# Patient Record
Sex: Female | Born: 1945 | Race: White | Hispanic: No | Marital: Married | Smoking: Former smoker
Health system: Southern US, Community
[De-identification: ages and names within clinical notes are randomized; demographics above are authoritative.]

## PROBLEM LIST (undated history)

## (undated) DIAGNOSIS — E119 Type 2 diabetes mellitus without complications: Secondary | ICD-10-CM

## (undated) DIAGNOSIS — E7800 Pure hypercholesterolemia, unspecified: Secondary | ICD-10-CM

## (undated) DIAGNOSIS — I10 Essential (primary) hypertension: Secondary | ICD-10-CM

## (undated) DIAGNOSIS — C541 Malignant neoplasm of endometrium: Secondary | ICD-10-CM

## (undated) DIAGNOSIS — I219 Acute myocardial infarction, unspecified: Secondary | ICD-10-CM

## (undated) HISTORY — PX: DILATION AND CURETTAGE OF UTERUS: SHX78

## (undated) HISTORY — DX: Acute myocardial infarction, unspecified: I21.9

## (undated) HISTORY — PX: CORONARY ANGIOPLASTY WITH STENT PLACEMENT: SHX49

## (undated) HISTORY — PX: REPLACEMENT TOTAL KNEE BILATERAL: SUR1225

## (undated) HISTORY — DX: Type 2 diabetes mellitus without complications: E11.9

## (undated) HISTORY — PX: OTHER SURGICAL HISTORY: SHX169

## (undated) HISTORY — DX: Pure hypercholesterolemia, unspecified: E78.00

## (undated) HISTORY — PX: TONSILLECTOMY: SUR1361

## (undated) HISTORY — DX: Essential (primary) hypertension: I10

## (undated) HISTORY — DX: Malignant neoplasm of endometrium: C54.1

---

## 2005-05-13 ENCOUNTER — Ambulatory Visit (HOSPITAL_COMMUNITY): Admission: RE | Admit: 2005-05-13 | Discharge: 2005-05-13 | Payer: Self-pay | Admitting: Orthopaedic Surgery

## 2005-06-17 ENCOUNTER — Ambulatory Visit (HOSPITAL_COMMUNITY): Admission: RE | Admit: 2005-06-17 | Discharge: 2005-06-17 | Payer: Self-pay | Admitting: Orthopaedic Surgery

## 2008-02-25 IMAGING — CR DG CHEST 2V
2 series · 2 of 2 positions shown · non-contrast
Comparison: none

CLINICAL DATA: Preoperative respiratory film.  Patient for knee surgery.  History of sarcoidosis.
 PA AND LATERAL CHEST:

[view not recorded (1 of 2)]
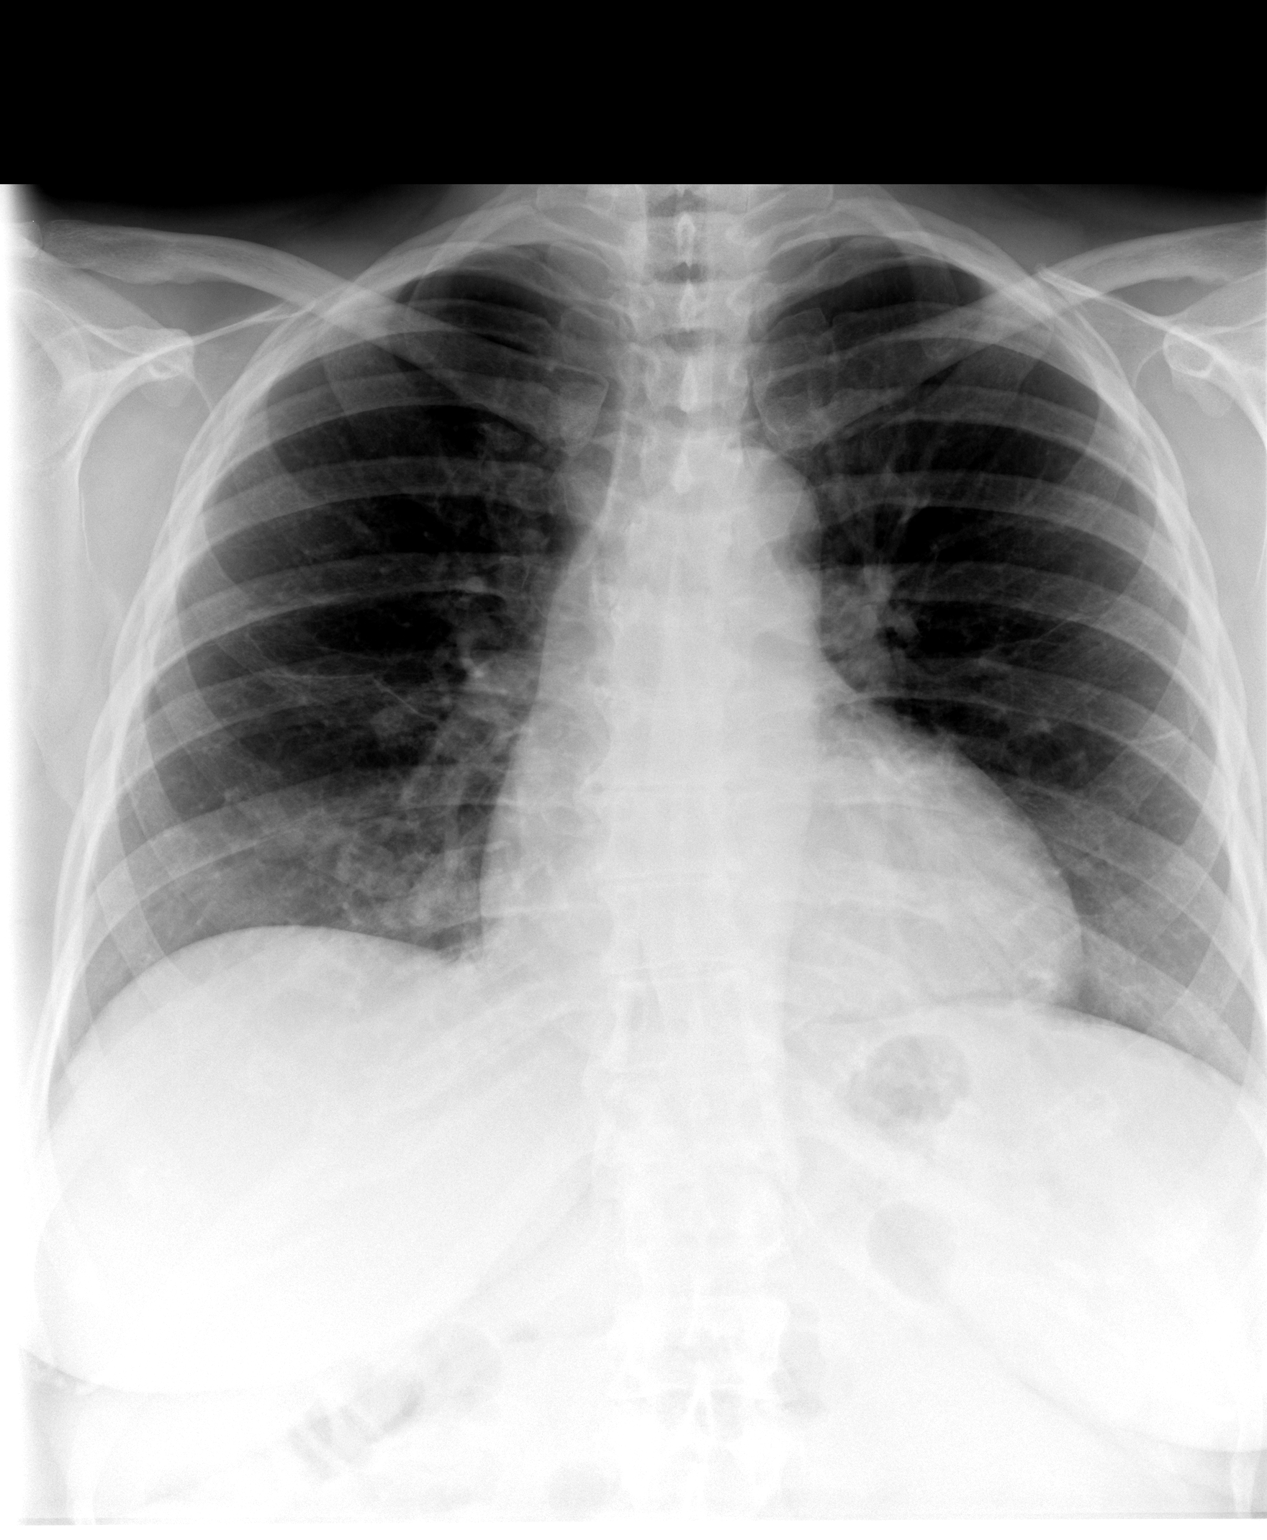

[view not recorded (2 of 2)]
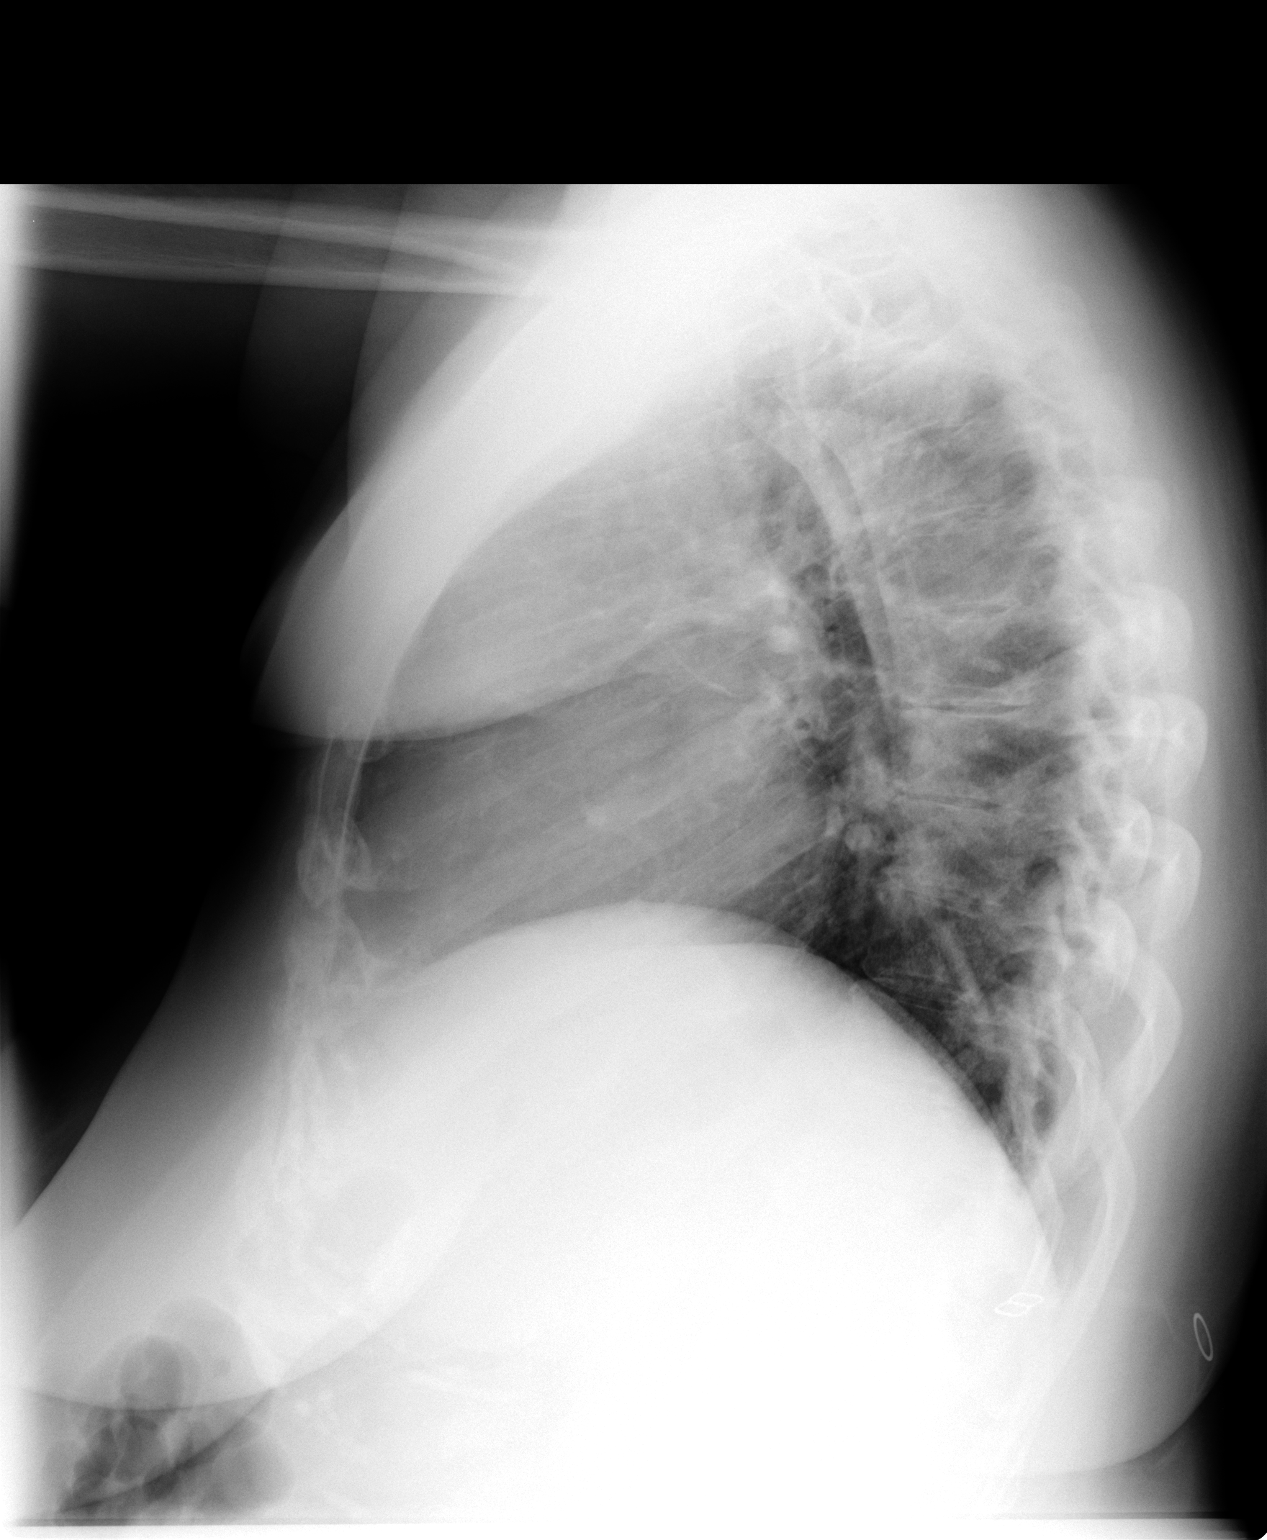

[2 of 2 positions shown; findings below may reference images not displayed]

FINDINGS: The lungs are clear.  No pleural effusion.  Heart size is mildly enlarged.  No lymphadenopathy.  No focal bony abnormality.
IMPRESSION: No acute disease.

## 2018-04-05 HISTORY — PX: TOTAL ABDOMINAL HYSTERECTOMY: SHX209

## 2019-12-19 ENCOUNTER — Encounter: Payer: Self-pay | Admitting: Internal Medicine

## 2020-02-19 ENCOUNTER — Other Ambulatory Visit: Payer: Self-pay

## 2020-02-19 ENCOUNTER — Encounter: Payer: Self-pay | Admitting: Gastroenterology

## 2020-02-19 ENCOUNTER — Ambulatory Visit: Payer: Medicare PPO | Admitting: Gastroenterology

## 2020-02-19 ENCOUNTER — Encounter: Payer: Self-pay | Admitting: *Deleted

## 2020-02-19 DIAGNOSIS — Z1211 Encounter for screening for malignant neoplasm of colon: Secondary | ICD-10-CM | POA: Diagnosis not present

## 2020-02-19 DIAGNOSIS — Z800 Family history of malignant neoplasm of digestive organs: Secondary | ICD-10-CM | POA: Diagnosis not present

## 2020-02-19 NOTE — Progress Notes (Signed)
Primary Care Physician:  Gardiner Rhyme, MD  Referring Physician: Dr. Manuella Ghazi  Primary Gastroenterologist:  Dr. Gala Romney   Chief Complaint  Patient presents with  . Colonoscopy    last tcs >10 yrs ago-no polyps  . Constipation    occ    HPI:   Briana Collier is a 74 y.o. female presenting today at the request of Dr. Manuella Ghazi for screening colonoscopy. Last colonoscopy greater than 10 years ago in [place]. She does not recall a history of colon polyps. Notes her father was diagnosed with colon cancer at age 3.   No rectal bleeding. No abdominal pain. No significant reflux issues unless eating tomatoes. No dysphagia. No changes in bowel habits. Getting off schedule will affect bowel habits. Drinks lots of water and eats fiber, which helps her stay regular. No unexplained weight loss or lack of appetite.   Past Medical History:  Diagnosis Date  . Diabetes ([place])   . Endometrial cancer ([place])   . HTN (hypertension)   . Hypercholesterolemia   . MI (myocardial infarction) ([place])    about 9 years ago     Past Surgical History:  Procedure Laterality Date  . cholecystectomy    . CORONARY ANGIOPLASTY WITH STENT PLACEMENT     around 2012  . DILATION AND CURETTAGE OF UTERUS    . REPLACEMENT TOTAL KNEE BILATERAL    . right knee arthroscopy    . TONSILLECTOMY    . TOTAL ABDOMINAL HYSTERECTOMY  2020    Current Outpatient Medications  Medication Sig Dispense Refill  . acetaminophen (TYLENOL) 650 MG CR tablet Take 1,300 mg by mouth at bedtime.    . ALPRAZolam (XANAX) 0.25 MG tablet Take 1 tablet by mouth as needed.    Marland Kitchen amLODipine (NORVASC) 10 MG tablet Take 1 tablet by mouth daily.    Marland Kitchen aspirin 81 MG EC tablet Take 1 tablet by mouth daily.    Marland Kitchen glipiZIDE (GLUCOTROL) 5 MG tablet Take 1 tablet by mouth 2 (two) times daily.    . insulin detemir (LEVEMIR FLEXTOUCH) 100 UNIT/ML FlexPen Inject 42 Units into the skin at bedtime.    . metFORMIN (GLUCOPHAGE-XR) 500 MG 24 hr tablet Take 1 tablet by  mouth daily.    . Multiple Vitamins-Minerals (CENTRUM SILVER PO) Take 1 tablet by mouth daily.    . Multiple Vitamins-Minerals (PRESERVISION AREDS 2 PO) Take 1 tablet by mouth 2 (two) times daily.    . simvastatin (ZOCOR) 20 MG tablet Take 1 tablet by mouth daily.    . valsartan-hydrochlorothiazide (DIOVAN-HCT) 160-12.5 MG tablet Take 1 tablet by mouth daily.     No current facility-administered medications for this visit.    Allergies as of 02/19/2020 - Review Complete 02/19/2020  Allergen Reaction Noted  . Demerol [meperidine hcl] Nausea And Vomiting 02/19/2020  . Morphine and related Nausea And Vomiting 02/19/2020  . Penicillins Hives 02/19/2020    Family History  Problem Relation Age of Onset  . Colon cancer Father 34    Social History   Socioeconomic History  . Marital status: Married    Spouse name: Not on file  . Number of children: Not on file  . Years of education: Not on file  . Highest education level: Not on file  Occupational History  . Occupation: retired    Comment: [employer]  Tobacco Use  . Smoking status: Former Research scientist (life sciences)  . Smokeless tobacco: Never Used  . Tobacco comment: quit age 12  Substance and Sexual Activity  .  Alcohol use: Never  . Drug use: Never  . Sexual activity: Not on file  Other Topics Concern  . Not on file  Social History Narrative  . Not on file   Social Determinants of Health   Financial Resource Strain:   . Difficulty of Paying Living Expenses: Not on file  Food Insecurity:   . Worried About Charity fundraiser in the Last Year: Not on file  . Ran Out of Food in the Last Year: Not on file  Transportation Needs:   . Lack of Transportation (Medical): Not on file  . Lack of Transportation (Non-Medical): Not on file  Physical Activity:   . Days of Exercise per Week: Not on file  . Minutes of Exercise per Session: Not on file  Stress:   . Feeling of Stress : Not on file  Social Connections:   . Frequency of Communication with  Friends and Family: Not on file  . Frequency of Social Gatherings with Friends and Family: Not on file  . Attends Religious Services: Not on file  . Active Member of Clubs or Organizations: Not on file  . Attends Archivist Meetings: Not on file  . Marital Status: Not on file  Intimate Partner Violence:   . Fear of Current or Ex-Partner: Not on file  . Emotionally Abused: Not on file  . Physically Abused: Not on file  . Sexually Abused: Not on file    Review of Systems: Gen: Denies any fever, chills, fatigue, weight loss, lack of appetite.  CV: Denies chest pain, heart palpitations, peripheral edema, syncope.  Resp: Denies shortness of breath at rest or with exertion. Denies wheezing or cough.  GI: see HPI GU : Denies urinary burning, urinary frequency, urinary hesitancy MS: Denies joint pain, muscle weakness, cramps, or limitation of movement.  Derm: Denies rash, itching, dry skin Psych: Denies depression, anxiety, memory loss, and confusion Heme: Denies bruising, bleeding, and enlarged lymph nodes.  Physical Exam: BP 140/89   Pulse 77   Temp (!) 96.8 F (36 C) (Temporal)   Ht 5\' 3"  (1.6 m)   Wt 210 lb 3.2 oz (95.3 kg)   BMI 37.24 kg/m  General:   Alert and oriented. Pleasant and cooperative. Well-nourished and well-developed.  Head:  Normocephalic and atraumatic. Eyes:  Without icterus, sclera clear and conjunctiva pink.  Ears:  Normal auditory acuity. Mouth:  Mask in place Lungs:  Clear to auscultation bilaterally. No wheezes, rales, or rhonchi. No distress.  Heart:  S1, S2 present with soft systolic murmur Abdomen:  +BS, soft, non-tender and non-distended. No HSM noted. No guarding or rebound. No masses appreciated.  Rectal:  Deferred  Msk:  Symmetrical without gross deformities. Normal posture. Extremities:  Without edema. Neurologic:  Alert and  oriented x4;  grossly normal neurologically. Skin:  Intact without significant lesions or rashes. Psych:  Alert  and cooperative. Normal mood and affect.  ASSESSMENT: Briana Collier is a 74 y.o. female presenting today at request of Dr. Manuella Ghazi for consideration of colonoscopy; her last colonoscopy was over 10 years ago without polyps. She does endorse family history of colon cancer in father at age 70.  She has no concerning lower GI signs/symptoms. Overall, in fairly good health. History of endometrial cancer diagnosed last year, undergoing total hysterectomy. Desires to pursue colonoscopy at this time.    PLAN: Proceed with colonoscopy by Dr. Gala Romney in near future: the risks, benefits, and alternatives have been discussed with the patient in detail.  The patient states understanding and desires to proceed.   Half dose insulin evening before, no oral diabetes medications day of procedure  Further recommendations to follow  Annitta Needs, PhD, ANP-BC [facility]

## 2020-02-19 NOTE — Patient Instructions (Addendum)
We are arranging a colonoscopy in the future with Dr. Gala Romney.  Please take 1/2 dose of insulin the evening before, and don't take glipizide or metformin the morning of the procedure.  Further recommendations to follow!  It was a pleasure to see you today. I want to create trusting relationships with patients to provide genuine, compassionate, and quality care. I value your feedback. If you receive a survey regarding your visit,  I greatly appreciate you taking time to fill this out.   Annitta Needs, PhD, ANP-BC [facility]astroenter[facility]

## 2020-04-08 ENCOUNTER — Telehealth: Payer: Self-pay | Admitting: *Deleted

## 2020-04-08 NOTE — Telephone Encounter (Signed)
PA approved via [employer] for TCS. Auth# [id number] DOS 04/23/2020-05/23/2020

## 2020-04-18 ENCOUNTER — Telehealth: Payer: Self-pay | Admitting: *Deleted

## 2020-04-18 NOTE — Telephone Encounter (Signed)
Called pt. Aware will have to call with March schedule once we receive it. Called endo and LMOVM.

## 2020-04-18 NOTE — Telephone Encounter (Signed)
Pt has procedure scheduled for 04/23/2020 but needs to reschedule due to winter weather coming in.  She said that their roads are terrible when it snows so she doesn't anticipate being able to get out.  Can you call her at your convenience to reschedule please?

## 2020-04-22 ENCOUNTER — Other Ambulatory Visit (HOSPITAL_COMMUNITY): Admission: RE | Admit: 2020-04-22 | Payer: Medicare PPO | Source: Ambulatory Visit

## 2020-04-23 ENCOUNTER — Ambulatory Visit (HOSPITAL_COMMUNITY): Admission: RE | Admit: 2020-04-23 | Payer: Medicare PPO | Source: Home / Self Care | Admitting: Internal Medicine

## 2020-04-23 ENCOUNTER — Encounter (HOSPITAL_COMMUNITY): Admission: RE | Payer: Self-pay | Source: Home / Self Care

## 2020-04-23 ENCOUNTER — Telehealth: Payer: Self-pay | Admitting: *Deleted

## 2020-04-23 SURGERY — COLONOSCOPY
Anesthesia: Moderate Sedation

## 2020-04-23 NOTE — Telephone Encounter (Signed)
Duplicate message. 

## 2020-04-29 NOTE — Telephone Encounter (Signed)
Called pt. She has been rescheduled to 3/2 at 9:30am. Aware will mail instructions with covid test appt. Confirmed mailing address.

## 2020-05-29 ENCOUNTER — Telehealth: Payer: Self-pay | Admitting: Internal Medicine

## 2020-05-29 NOTE — Telephone Encounter (Signed)
Spoke with pt. Call was address. Pt discuss her medication that was given by her GYN doctor. Pt will discuss further with that office.

## 2020-05-29 NOTE — Telephone Encounter (Signed)
Pt has questions about a medication. Please call her at [phone]

## 2020-06-02 ENCOUNTER — Other Ambulatory Visit (HOSPITAL_COMMUNITY)
Admission: RE | Admit: 2020-06-02 | Discharge: 2020-06-02 | Disposition: A | Discharge status: Home / Self Care | Payer: Medicare PPO | Source: Ambulatory Visit | Attending: Internal Medicine | Admitting: Internal Medicine

## 2020-06-02 ENCOUNTER — Other Ambulatory Visit: Payer: Self-pay

## 2020-06-03 ENCOUNTER — Other Ambulatory Visit (HOSPITAL_COMMUNITY)
Admission: RE | Admit: 2020-06-03 | Discharge: 2020-06-03 | Disposition: A | Discharge status: Home / Self Care | Payer: Medicare PPO | Source: Ambulatory Visit | Attending: Internal Medicine | Admitting: Internal Medicine

## 2020-06-03 DIAGNOSIS — Z01812 Encounter for preprocedural laboratory examination: Secondary | ICD-10-CM | POA: Insufficient documentation

## 2020-06-03 DIAGNOSIS — Z20822 Contact with and (suspected) exposure to covid-19: Secondary | ICD-10-CM | POA: Insufficient documentation

## 2020-06-03 LAB — SARS CORONAVIRUS 2 (TAT 6-24 HRS): SARS Coronavirus 2: NEGATIVE

## 2020-06-03 NOTE — Progress Notes (Signed)
I created another covid order today because yesterday's specimen was discontinued. [facility] was not able to process the specimen. Pt. Came back this morning and I swabbed her again. Threasa Beards called me and told me what had happened. She called Hoyle Sauer and asked her to schedule pt.'s appointment for today. When I went to release the order it said release anyway or cancel. I clicked on release it anyway. (do over)

## 2020-06-04 ENCOUNTER — Other Ambulatory Visit: Payer: Self-pay

## 2020-06-04 ENCOUNTER — Ambulatory Visit (HOSPITAL_COMMUNITY)
Admission: RE | Admit: 2020-06-04 | Discharge: 2020-06-04 | Disposition: A | Discharge status: Home / Self Care | Payer: Medicare PPO | Attending: Internal Medicine | Admitting: Internal Medicine

## 2020-06-04 ENCOUNTER — Encounter (HOSPITAL_COMMUNITY): Payer: Self-pay | Admitting: Internal Medicine

## 2020-06-04 ENCOUNTER — Encounter (HOSPITAL_COMMUNITY)
Admission: RE | Disposition: A | Discharge status: Home / Self Care | Payer: Self-pay | Source: Home / Self Care | Attending: Internal Medicine

## 2020-06-04 DIAGNOSIS — K5730 Diverticulosis of large intestine without perforation or abscess without bleeding: Secondary | ICD-10-CM | POA: Insufficient documentation

## 2020-06-04 DIAGNOSIS — Z880 Allergy status to penicillin: Secondary | ICD-10-CM | POA: Diagnosis not present

## 2020-06-04 DIAGNOSIS — Z79899 Other long term (current) drug therapy: Secondary | ICD-10-CM | POA: Insufficient documentation

## 2020-06-04 DIAGNOSIS — K5140 Inflammatory polyps of colon without complications: Secondary | ICD-10-CM | POA: Insufficient documentation

## 2020-06-04 DIAGNOSIS — K635 Polyp of colon: Secondary | ICD-10-CM

## 2020-06-04 DIAGNOSIS — I441 Atrioventricular block, second degree: Secondary | ICD-10-CM | POA: Diagnosis not present

## 2020-06-04 DIAGNOSIS — Z800 Family history of malignant neoplasm of digestive organs: Secondary | ICD-10-CM | POA: Diagnosis not present

## 2020-06-04 DIAGNOSIS — Z885 Allergy status to narcotic agent status: Secondary | ICD-10-CM | POA: Insufficient documentation

## 2020-06-04 DIAGNOSIS — D124 Benign neoplasm of descending colon: Secondary | ICD-10-CM | POA: Insufficient documentation

## 2020-06-04 DIAGNOSIS — Z7984 Long term (current) use of oral hypoglycemic drugs: Secondary | ICD-10-CM | POA: Insufficient documentation

## 2020-06-04 DIAGNOSIS — Z7982 Long term (current) use of aspirin: Secondary | ICD-10-CM | POA: Insufficient documentation

## 2020-06-04 DIAGNOSIS — D122 Benign neoplasm of ascending colon: Secondary | ICD-10-CM | POA: Insufficient documentation

## 2020-06-04 DIAGNOSIS — Z87891 Personal history of nicotine dependence: Secondary | ICD-10-CM | POA: Diagnosis not present

## 2020-06-04 DIAGNOSIS — Z1211 Encounter for screening for malignant neoplasm of colon: Secondary | ICD-10-CM

## 2020-06-04 DIAGNOSIS — Z794 Long term (current) use of insulin: Secondary | ICD-10-CM | POA: Diagnosis not present

## 2020-06-04 HISTORY — PX: COLONOSCOPY: SHX5424

## 2020-06-04 HISTORY — PX: POLYPECTOMY: SHX5525

## 2020-06-04 LAB — GLUCOSE, CAPILLARY: Glucose-Capillary: 179 mg/dL — ABNORMAL HIGH (ref 70–99)

## 2020-06-04 SURGERY — COLONOSCOPY
Anesthesia: Moderate Sedation

## 2020-06-04 MED ORDER — FENTANYL CITRATE (PF) 100 MCG/2ML IJ SOLN
INTRAMUSCULAR | Status: DC | PRN
  Administered 2020-06-04: 25 ug via INTRAVENOUS
  Administered 2020-06-04: 12.5 ug via INTRAVENOUS

## 2020-06-04 MED ORDER — ONDANSETRON HCL 4 MG/2ML IJ SOLN
INTRAMUSCULAR | Status: AC
  Filled 2020-06-04: qty 2

## 2020-06-04 MED ORDER — MIDAZOLAM HCL 5 MG/5ML IJ SOLN
INTRAMUSCULAR | Status: AC
  Filled 2020-06-04: qty 10

## 2020-06-04 MED ORDER — SODIUM CHLORIDE 0.9 % IV SOLN: INTRAVENOUS | Status: DC

## 2020-06-04 MED ORDER — FENTANYL CITRATE (PF) 100 MCG/2ML IJ SOLN
INTRAMUSCULAR | Status: AC
  Filled 2020-06-04: qty 2

## 2020-06-04 MED ORDER — STERILE WATER FOR IRRIGATION IR SOLN
Status: DC | PRN
  Administered 2020-06-04: 1.5 mL

## 2020-06-04 MED ORDER — MIDAZOLAM HCL 5 MG/5ML IJ SOLN
INTRAMUSCULAR | Status: DC | PRN
  Administered 2020-06-04 (×2): 1 mg via INTRAVENOUS
  Administered 2020-06-04: 2 mg via INTRAVENOUS
  Administered 2020-06-04 (×4): 1 mg via INTRAVENOUS

## 2020-06-04 MED ORDER — ONDANSETRON HCL 4 MG/2ML IJ SOLN
INTRAMUSCULAR | Status: DC | PRN
  Administered 2020-06-04: 4 mg via INTRAVENOUS

## 2020-06-04 NOTE — H&P (Signed)
@LOGO @   Primary Care Physician:  Gardiner Rhyme, MD Primary Gastroenterologist:  Dr. Gala Romney  Pre-Procedure History & Physical: HPI:  Briana Collier is a 75 y.o. female here for screening colonoscopy.  Father with colon cancer.  Occasional constipation otherwise she is devoid of any lower GI tract symptoms.  Last colonoscopy  - distant past per patient-potentially 20 years ago.  Past Medical History:  Diagnosis Date  . Diabetes ([place])   . Endometrial cancer ([place])   . HTN (hypertension)   . Hypercholesterolemia   . MI (myocardial infarction) ([place])    about 9 years ago     Past Surgical History:  Procedure Laterality Date  . cholecystectomy    . CORONARY ANGIOPLASTY WITH STENT PLACEMENT     around 2012  . DILATION AND CURETTAGE OF UTERUS    . REPLACEMENT TOTAL KNEE BILATERAL    . right knee arthroscopy    . TONSILLECTOMY    . TOTAL ABDOMINAL HYSTERECTOMY  2020    Prior to Admission medications   Medication Sig Start Date End Date Taking? Authorizing Provider  acetaminophen (TYLENOL) 650 MG CR tablet Take 1,300 mg by mouth at bedtime.   Yes [provider]  ALPRAZolam (XANAX) 0.25 MG tablet Take 0.25 mg by mouth daily as needed for anxiety. 01/29/19  Yes [provider]  amLODipine (NORVASC) 10 MG tablet Take 10 mg by mouth daily. 02/04/19  Yes [provider]  ASPERCREME LIDOCAINE EX Apply 1 application topically daily as needed (pain).   Yes [provider]  aspirin 81 MG EC tablet Take 81 mg by mouth daily.   Yes [provider]  calcium carbonate (TUMS - DOSED IN MG ELEMENTAL CALCIUM) 500 MG chewable tablet Chew 500 mg by mouth daily as needed for indigestion or heartburn.   Yes [provider]  glipiZIDE (GLUCOTROL) 5 MG tablet Take 5 mg by mouth 2 (two) times daily before a meal. 03/06/19  Yes [provider]  insulin detemir (LEVEMIR FLEXTOUCH) 100 UNIT/ML FlexPen Inject 42 Units into the skin at bedtime. 01/12/19   Yes [provider]  Multiple Vitamins-Minerals (CENTRUM SILVER PO) Take 1 tablet by mouth daily.   Yes [provider]  Multiple Vitamins-Minerals (PRESERVISION AREDS 2 PO) Take 1 tablet by mouth 2 (two) times daily.   Yes [provider]  Polyethyl Glycol-Propyl Glycol (SYSTANE OP) Place 1 drop into both eyes daily as needed (dry eyes).   Yes [provider]  simvastatin (ZOCOR) 20 MG tablet Take 20 mg by mouth every evening. 02/07/19  Yes [provider]  triamcinolone (NASACORT) 55 MCG/ACT AERO nasal inhaler Place 2 sprays into the nose daily as needed (allergies).   Yes [provider]  valsartan-hydrochlorothiazide (DIOVAN-HCT) 160-12.5 MG tablet Take 1 tablet by mouth daily. 09/10/19  Yes [provider]  metFORMIN (GLUCOPHAGE-XR) 500 MG 24 hr tablet Take 500 mg by mouth every evening. 12/25/19   [provider]    Allergies as of 04/29/2020 - Review Complete 04/16/2020  Allergen Reaction Noted  . Demerol [meperidine hcl] Nausea And Vomiting 02/19/2020  . Morphine and related Nausea And Vomiting 02/19/2020  . Penicillins Hives 02/19/2020    Family History  Problem Relation Age of Onset  . Colon cancer Father 52    Social History   Socioeconomic History  . Marital status: Married    Spouse name: Not on file  . Number of children: Not on file  . Years of education: Not on  file  . Highest education level: Not on file  Occupational History  . Occupation: retired    Comment: [employer]  Tobacco Use  . Smoking status: Former Research scientist (life sciences)  . Smokeless tobacco: Never Used  . Tobacco comment: quit age 57  Vaping Use  . Vaping Use: Never used  Substance and Sexual Activity  . Alcohol use: Never  . Drug use: Never  . Sexual activity: Not on file  Other Topics Concern  . Not on file  Social History Narrative  . Not on file   Social Determinants of Health   Financial Resource Strain: Not on file  Food  Insecurity: Not on file  Transportation Needs: Not on file  Physical Activity: Not on file  Stress: Not on file  Social Connections: Not on file  Intimate Partner Violence: Not on file    Review of Systems: See HPI, otherwise negative ROS  Physical Exam: BP (!) 148/80   Pulse 60   Temp 97.9 F (36.6 C) (Oral)   Resp 17   Ht 5\' 3"  (1.6 m)   Wt 92.5 kg   SpO2 99%   BMI 36.14 kg/m  General:   Alert,  Well-developed, well-nourished, pleasant and cooperative in NAD Neck:  Supple; no masses or thyromegaly. No significant cervical adenopathy. Lungs:  Clear throughout to auscultation.   No wheezes, crackles, or rhonchi. No acute distress. Heart:  Regular rate and rhythm; no murmurs, clicks, rubs,  or gallops. Abdomen: Non-distended, normal bowel sounds.  Soft and nontender without appreciable mass or hepatosplenomegaly.   Impression/Plan: 75 year old lady here for screening colonoscopy.The risks, benefits, limitations, alternatives and imponderables have been reviewed with the patient. Questions have been answered. All parties are agreeable.      Notice: This dictation was prepared with Dragon dictation along with smaller phrase technology. Any transcriptional errors that result from this process are unintentional and may not be corrected upon review.

## 2020-06-04 NOTE — Progress Notes (Signed)
Pt entered post-op with noted heart block on ECG.  Intra-op nurse, Otis Peak, stated pt went into A-Fib during procedure but then converted to NSR.  Informed Dr. Gala Romney who ordered EKG.  VSS, skin pink, warm, and dry.  Pt denies any chest discomfort or dizziness.  EKG reviewed by Dr. Gala Romney, copy given to pt and instructed to keep appt with cardiologist which pt states is scheduled next Tues.  Advised pt to go to ED for palpitations, chest discomfort, dizziness, or other concerns.  Pt verbalized understanding.

## 2020-06-04 NOTE — Discharge Instructions (Signed)
Colonoscopy Discharge Instructions  Read the instructions outlined below and refer to this sheet in the next few weeks. These discharge instructions provide you with general information on caring for yourself after you leave the hospital. Your doctor may also give you specific instructions. While your treatment has been planned according to the most current medical practices available, unavoidable complications occasionally occur. If you have any problems or questions after discharge, call Dr. Gala Romney at [phone]. ACTIVITY  You may resume your regular activity, but move at a slower pace for the next 24 hours.   Take frequent rest periods for the next 24 hours.   Walking will help get rid of the air and reduce the bloated feeling in your belly (abdomen).   No driving for 24 hours (because of the medicine (anesthesia) used during the test).    Do not sign any important legal documents or operate any machinery for 24 hours (because of the anesthesia used during the test).  NUTRITION  Drink plenty of fluids.   You may resume your normal diet as instructed by your doctor.   Begin with a light meal and progress to your normal diet. Heavy or fried foods are harder to digest and may make you feel sick to your stomach (nauseated).   Avoid alcoholic beverages for 24 hours or as instructed.  MEDICATIONS  You may resume your normal medications unless your doctor tells you otherwise.  WHAT YOU CAN EXPECT TODAY  Some feelings of bloating in the abdomen.   Passage of more gas than usual.   Spotting of blood in your stool or on the toilet paper.  IF YOU HAD POLYPS REMOVED DURING THE COLONOSCOPY:  No aspirin products for 7 days or as instructed.   No alcohol for 7 days or as instructed.   Eat a soft diet for the next 24 hours.  FINDING OUT THE RESULTS OF YOUR TEST Not all test results are available during your visit. If your test results are not back during the visit, make an appointment  with your caregiver to find out the results. Do not assume everything is normal if you have not heard from your caregiver or the medical facility. It is important for you to follow up on all of your test results.  SEEK IMMEDIATE MEDICAL ATTENTION IF:  You have more than a spotting of blood in your stool.   Your belly is swollen (abdominal distention).   You are nauseated or vomiting.   You have a temperature over 101.   You have abdominal pain or discomfort that is severe or gets worse throughout the day.    5 polyps removed in your colon today  Polyp diverticulosis information provided  Further recommendations to follow pending review of pathology report  At patient request, I called Kyllie Pettijohn at [phone] -got voicemail.  Left message with information.  Use a stool softener as needed for occasional constipation.  Abnormal rhythm on monitor followed up at EKG which shows some variable degree of heart block.  Since you are not having any symptoms at this time we will give you a copy of the EKG and recommend you see Dr. Manuella Ghazi or your cardiologist later this week for further evaluation as they deem appropriate.--Keep scheduled appt with cardiologist for Tuesday, March 8   Diverticulosis  Diverticulosis is a condition that develops when small pouches (diverticula) form in the wall of the large intestine (colon). The colon is where water is absorbed and stool (feces) is formed. The pouches  form when the inside layer of the colon pushes through weak spots in the outer layers of the colon. You may have a few pouches or many of them. The pouches usually do not cause problems unless they become inflamed or infected. When this happens, the condition is called diverticulitis. What are the causes? The cause of this condition is not known. What increases the risk? The following factors may make you more likely to develop this condition:  Being older than age 31. Your risk for this  condition increases with age. Diverticulosis is rare among people younger than age 16. By age 17, many people have it.  Eating a low-fiber diet.  Having frequent constipation.  Being overweight.  Not getting enough exercise.  Smoking.  Taking over-the-counter pain medicines, like aspirin and ibuprofen.  Having a family history of diverticulosis. What are the signs or symptoms? In most people, there are no symptoms of this condition. If you do have symptoms, they may include:  Bloating.  Cramps in the abdomen.  Constipation or diarrhea.  Pain in the lower left side of the abdomen. How is this diagnosed? Because diverticulosis usually has no symptoms, it is most often diagnosed during an exam for other colon problems. The condition may be diagnosed by:  Using a flexible scope to examine the colon (colonoscopy).  Taking an X-ray of the colon after dye has been put into the colon (barium enema).  Having a CT scan. How is this treated? You may not need treatment for this condition. Your health care provider may recommend treatment to prevent problems. You may need treatment if you have symptoms or if you previously had diverticulitis. Treatment may include:  Eating a high-fiber diet.  Taking a fiber supplement.  Taking a live bacteria supplement (probiotic).  Taking medicine to relax your colon.   Follow these instructions at home: Medicines  Take over-the-counter and prescription medicines only as told by your health care provider.  If told by your health care provider, take a fiber supplement or probiotic. Constipation prevention Your condition may cause constipation. To prevent or treat constipation, you may need to:  Drink enough fluid to keep your urine pale yellow.  Take over-the-counter or prescription medicines.  Eat foods that are high in fiber, such as beans, whole grains, and fresh fruits and vegetables.  Limit foods that are high in fat and processed  sugars, such as fried or sweet foods.   General instructions  Try not to strain when you have a bowel movement.  Keep all follow-up visits as told by your health care provider. This is important. Contact a health care provider if you:  Have pain in your abdomen.  Have bloating.  Have cramps.  Have not had a bowel movement in 3 days. Get help right away if:  Your pain gets worse.  Your bloating becomes very bad.  You have a fever or chills, and your symptoms suddenly get worse.  You vomit.  You have bowel movements that are bloody or black.  You have bleeding from your rectum. Summary  Diverticulosis is a condition that develops when small pouches (diverticula) form in the wall of the large intestine (colon).  You may have a few pouches or many of them.  This condition is most often diagnosed during an exam for other colon problems.  Treatment may include increasing the fiber in your diet, taking supplements, or taking medicines. This information is not intended to replace advice given to you by your health care  provider. Make sure you discuss any questions you have with your health care provider. Document Revised: 10/19/2018 Document Reviewed: 10/19/2018 Elsevier Patient Education  [place].  Colon Polyps  Colon polyps are tissue growths inside the colon, which is part of the large intestine. They are one of the types of polyps that can grow in the body. A polyp may be a round bump or a mushroom-shaped growth. You could have one polyp or more than one. Most colon polyps are noncancerous (benign). However, some colon polyps can become cancerous over time. Finding and removing the polyps early can help prevent this. What are the causes? The exact cause of colon polyps is not known. What increases the risk? The following factors may make you more likely to develop this condition:  Having a family history of colorectal cancer or colon polyps.  Being older  than 76 years of age.  Being younger than 76 years of age and having a significant family history of colorectal cancer or colon polyps or a genetic condition that puts you at higher risk of getting colon polyps.  Having inflammatory bowel disease, such as ulcerative colitis or Crohn's disease.  Having certain conditions passed from parent to child (hereditary conditions), such as: ? Familial adenomatous polyposis (FAP). ? Lynch syndrome. ? Turcot syndrome. ? Peutz-Jeghers syndrome. ? MUTYH-associated polyposis (MAP).  Being overweight.  Certain lifestyle factors. These include smoking cigarettes, drinking too much alcohol, not getting enough exercise, and eating a diet that is high in fat and red meat and low in fiber.  Having had childhood cancer that was treated with radiation of the abdomen. What are the signs or symptoms? Many times, there are no symptoms. If you have symptoms, they may include:  Blood coming from the rectum during a bowel movement.  Blood in the stool (feces). The blood may be bright red or very dark in color.  Pain in the abdomen.  A change in bowel habits, such as constipation or diarrhea. How is this diagnosed? This condition is diagnosed with a colonoscopy. This is a procedure in which a lighted, flexible scope is inserted into the opening between the buttocks (anus) and then passed into the colon to examine the area. Polyps are sometimes found when a colonoscopy is done as part of routine cancer screening tests. How is this treated? This condition is treated by removing any polyps that are found. Most polyps can be removed during a colonoscopy. Those polyps will then be tested for cancer. Additional treatment may be needed depending on the results of testing. Follow these instructions at home: Eating and drinking  Eat foods that are high in fiber, such as fruits, vegetables, and whole grains.  Eat foods that are high in calcium and vitamin D, such as  milk, cheese, yogurt, eggs, liver, fish, and broccoli.  Limit foods that are high in fat, such as fried foods and desserts.  Limit the amount of red meat, precooked or cured meat, or other processed meat that you eat, such as hot dogs, sausages, bacon, or meat loaves.  Limit sugary drinks.   Lifestyle  Maintain a healthy weight, or lose weight if recommended by your health care provider.  Exercise every day or as told by your health care provider.  Do not use any products that contain nicotine or tobacco, such as cigarettes, e-cigarettes, and chewing tobacco. If you need help quitting, ask your health care provider.  Do not drink alcohol if: ? Your health care provider tells you  not to drink. ? You are pregnant, may be pregnant, or are planning to become pregnant.  If you drink alcohol: ? Limit how much you use to:  0-1 drink a day for women.  0-2 drinks a day for men. ? Know how much alcohol is in your drink. In the [place] one drink equals one 12 oz bottle of beer (355 mL), one 5 oz glass of wine (148 mL), or one 1 oz glass of hard liquor (44 mL). General instructions  Take over-the-counter and prescription medicines only as told by your health care provider.  Keep all follow-up visits. This is important. This includes having regularly scheduled colonoscopies. Talk to your health care provider about when you need a colonoscopy. Contact a health care provider if:  You have new or worsening bleeding during a bowel movement.  You have new or increased blood in your stool.  You have a change in bowel habits.  You lose weight for no known reason. Summary  Colon polyps are tissue growths inside the colon, which is part of the large intestine. They are one type of polyp that can grow in the body.  Most colon polyps are noncancerous (benign), but some can become cancerous over time.  This condition is diagnosed with a colonoscopy.  This condition is treated by removing any  polyps that are found. Most polyps can be removed during a colonoscopy. This information is not intended to replace advice given to you by your health care provider. Make sure you discuss any questions you have with your health care provider. Document Revised: 07/11/2019 Document Reviewed: 07/11/2019 Elsevier Patient Education  2021 [employer].

## 2020-06-04 NOTE — Op Note (Addendum)
[facility] Patient Name: Briana Collier Procedure Date: 06/04/2020 8:45 AM MRN: [id number] Date of Birth: May 16, 1945 Attending MD: Norvel Richards , MD CSN: [id number] Age: 75 Admit Type: Outpatient Procedure:                Colonoscopy Indications:              Screening for colorectal malignant neoplasm Providers:                Norvel Richards, MD, Jeanann Lewandowsky. Sharon Seller, RN,                            Wynonia Musty Tech, Technician Referring MD:              Medicines:                Fentanyl 37.5 micrograms IV, Midazolam 4 mg IV Complications:            No immediate complications. Estimated Blood Loss:     Estimated blood loss was minimal. Procedure:                Pre-Anesthesia Assessment:                           - Prior to the procedure, a History and Physical                            was performed, and patient medications and                            allergies were reviewed. The patient's tolerance of                            previous anesthesia was also reviewed. The risks                            and benefits of the procedure and the sedation                            options and risks were discussed with the patient.                            All questions were answered, and informed consent                            was obtained. Prior Anticoagulants: The patient has                            taken no previous anticoagulant or antiplatelet                            agents. ASA Grade Assessment: III - A patient with                            severe systemic disease. After reviewing the risks  and benefits, the patient was deemed in                            satisfactory condition to undergo the procedure.                           After obtaining informed consent, the colonoscope                            was passed under direct vision. Throughout the                            procedure, the patient's blood pressure,  pulse, and                            oxygen saturations were monitored continuously. The                            CF-[id number] ([id number]) scope was introduced through                            the anus and advanced to the the cecum, identified                            by appendiceal orifice and ileocecal valve. The                            colonoscopy was performed without difficulty. The                            patient tolerated the procedure well. The quality                            of the bowel preparation was adequate. Scope In: 9:06:25 AM Scope Out: 9:37:15 AM Scope Withdrawal Time: 0 hours 15 minutes 46 seconds  Total Procedure Duration: 0 hours 30 minutes 50 seconds  Findings:      The perianal and digital rectal examinations were normal.      Many small and large-mouthed diverticula were found in the sigmoid       colon, descending colon and transverse colon.      Five semi-pedunculated polyps were found in the sigmoid colon,       descending colon and ascending colon. The polyps were 5 to 8 mm in size.       These polyps were removed with a cold snare. Resection and retrieval       were complete. Estimated blood loss was minimal.      The exam was otherwise without abnormality on direct and retroflexion       views. Impression:               - Diverticulosis in the sigmoid colon, in the                            descending colon and in the transverse colon.                           -  Five 5 to 8 mm polyps in the sigmoid colon, in                            the descending colon and in the ascending colon,                            removed with a cold snare. Resected and retrieved.                           - The examination was otherwise normal on direct                            and retroflexion views. Moderate Sedation:      Moderate (conscious) sedation was administered by the endoscopy nurse       and supervised by the endoscopist. The following parameters  were       monitored: oxygen saturation, heart rate, blood pressure, respiratory       rate, EKG, adequacy of pulmonary ventilation, and response to care.       Total physician intraservice time was 35 minutes. Recommendation:           - Patient has a contact number available for                            emergencies. The signs and symptoms of potential                            delayed complications were discussed with the                            patient. Return to normal activities tomorrow.                            Written discharge instructions were provided to the                            patient.                           - Resume previous diet.                           - Continue present medications.                           - Repeat colonoscopy date to be determined after                            pending pathology results are reviewed for                            surveillance based on pathology results.                           - Return to GI office (date not yet determined).  Addendum: Patient was noted to have apparent sinus                            pauses before during and after the procedure on the                            monitor. Twelve-lead EKG obtained afterwards                            demonstrates variable second-degree AV block.                            Patient without any symptoms whatsoever recently.                            This is an incidental finding but needs further                            evaluation. I have given a copy of the tracing to                            the patient and advised she follow-up with either                            her cardiologist or Dr. Manuella Ghazi this week. Procedure Code(s):        --- Professional ---                           [phone], Colonoscopy, flexible; with removal of                            tumor(s), polyp(s), or other lesion(s) by snare                             technique                           99153, Moderate sedation; each additional 15                            minutes intraservice time                           G0500, Moderate sedation services provided by the                            same physician or other qualified health care                            professional performing a gastrointestinal                            endoscopic service that sedation supports,  requiring the presence of an independent trained                            observer to assist in the monitoring of the                            patient's level of consciousness and physiological                            status; initial 15 minutes of intra-service time;                            patient age 83 years or older (additional time may                            be reported with [id number], as appropriate) Diagnosis Code(s):        --- Professional ---                           Z12.11, Encounter for screening for malignant                            neoplasm of colon                           K63.5, Polyp of colon                           K57.30, Diverticulosis of large intestine without                            perforation or abscess without bleeding CPT copyright 2019 [employer]. All rights reserved. The codes documented in this report are preliminary and upon coder review may  be revised to meet current compliance requirements. Cristopher Estimable. Rourk, MD Norvel Richards, MD 06/04/2020 9:45:14 AM This report has been signed electronically. Number of Addenda: 0

## 2020-06-05 ENCOUNTER — Encounter: Payer: Self-pay | Admitting: Internal Medicine

## 2020-06-05 LAB — SURGICAL PATHOLOGY

## 2020-06-10 ENCOUNTER — Encounter (HOSPITAL_COMMUNITY): Payer: Self-pay | Admitting: Internal Medicine

## 2021-07-27 ENCOUNTER — Encounter: Payer: Self-pay | Admitting: Gastroenterology

## 2021-07-27 NOTE — Progress Notes (Signed)
? ?GI Office Note   ? ?Referring Provider: Gardiner Rhyme, MD ?Primary Care Physician:  Gardiner Rhyme, MD ?Primary GI: Dr. Gala Romney ? ?Date:  07/29/2021  ?ID:  Briana Collier, DOB 1946-03-24, MRN [id number] ? ? ?Chief Complaint  ? ?Chief Complaint  ?Patient presents with  ? Abdominal Pain  ?  Pain across lower stomach, constipation   ? ? ? ?History of Present Illness  ?Briana Collier is a 76 y.o. female with a history of diabetes, HTN, HLD, endometrial cancer s/p total hysterectomy January 2021, and MI presenting today with complaint of abdominal pain and alternating constipation and diarrhea. ? ?Last seen in our office 02/19/2020.  She was evaluated for screening colonoscopy.  Denied any GI complaints other than reflux with eating tomatoes.  She had no abdominal pain or change in her bowel habits.  Drinking lots of water and eating fiber daily.  She noted family history of colon cancer in her father. ? ?Last colonoscopy 06/04/2020 - diverticulosis in the sigmoid, descending, transverse colon, five 5-8 mm polyps in the sigmoid, descending, ascending colon.  Pathology revealing inflammatory polyp in the sigmoid, and tubular adenoma in the ascending and descending polypectomies. ? ? ?Today: ? ?Abdominal Pain ?Patient complains of abdominal pain. The pain is located in the RLQ and will sometimes move across . The pain is described as cramping. Onset was several months ago. She has seen a the GYN then noticed abdominal pain the next day, almost doubled over and now has an intermittent pain in her RLQ. Symptoms have been intermittent since. Aggravating factors include nothing in particular that she can think of, just comes and goes.  Alleviating factors include acetaminophen. Associated symptoms include constipation, diarrhea, and flatus. The patient denies anorexia, arthralgias, belching, chills, dysuria, fever, hematochezia, hematuria, melena, nausea, and vomiting.  ? ? ?Constipation/Diarrhea ?Patient complains of constipation.  Onset was 1  year  ago. Patient has been having occasional firm and loose stools that vary from everyday to sometimes going 3 days without a BM mixed with some periods of diarrhea with 3-4 loose stools in a day. Defecation has been incomplete. Co-Morbid conditions: diabetes, HTN, and anxiety . Symptoms have waxed and waned and has associated abdominal cramping.  Denies nocturnal stools. Current Health Habits: Eating fiber? yes, Exercise? yes - walking as much as tolerated with HR and allergies (goal 3,000 steps), Adequate hydration? yes - to the point she feels like her belly is full. Current over the counter/prescription laxative:  dulcolax stool softener and metamucil  which has been somewhat effective. Has tried metamucil (1 teaspoon) will produce a little bit of something, if she takes 2 teaspoons then have diarrhea. Feels better is she can go everyday. Before if she ate avocado it would go right through her, now she can eat it without a problem. Drinks tea and coffee in the morning. Has been trying to walk more as well. No BRBPR or melena. Miralax was awful tasting.  ? ? ?Has had weight loss associated with diarrhea in the fall (had a lack of appetite). Lost 25 lb total in the last year. No longer feeling like she has to make herself eat. Is working on counting carbs to manage her blood sugar. Has been reducing her BP meds and her insulin as well. No reflux symptoms unless she takes metamucil at night, citrus or tomato's. Will get severe reflux with greens.  ? ?Has had a lower pulse recently, there is possible consideration of a pacemaker due to  bradycardia. Had a consult at [facility]. ? ?Past Medical History:  ?Diagnosis Date  ? Diabetes ([place])   ? Endometrial cancer ([place])   ? HTN (hypertension)   ? Hypercholesterolemia   ? MI (myocardial infarction) ([place])   ? about 9 years ago   ? ? ?Past Surgical History:  ?Procedure Laterality Date  ? cholecystectomy    ? COLONOSCOPY N/A 06/04/2020  ? iverticulosis in the  sigmoid, descending, transverse colon, five 5-8 mm polyps in the sigmoid, descending, ascending colon  ? CORONARY ANGIOPLASTY WITH STENT PLACEMENT    ? around 2012  ? DILATION AND CURETTAGE OF UTERUS    ? POLYPECTOMY  06/04/2020  ? Procedure: POLYPECTOMY;  Surgeon: Daneil Dolin, MD;  [facility];  Service: Endoscopy;;  ? REPLACEMENT TOTAL KNEE BILATERAL    ? right knee arthroscopy    ? TONSILLECTOMY    ? TOTAL ABDOMINAL HYSTERECTOMY  2020  ? ? ?Current Outpatient Medications  ?Medication Sig Dispense Refill  ? acetaminophen (TYLENOL) 650 MG CR tablet Take 1,300 mg by mouth at bedtime.    ? ALPRAZolam (XANAX) 0.25 MG tablet Take 0.25 mg by mouth daily as needed for anxiety.    ? amLODipine (NORVASC) 10 MG tablet Take 10 mg by mouth daily.    ? ASPERCREME LIDOCAINE EX Apply 1 application topically daily as needed (pain).    ? aspirin 81 MG EC tablet Take 81 mg by mouth daily.    ? glipiZIDE (GLUCOTROL) 5 MG tablet Take 5 mg by mouth 2 (two) times daily before a meal.    ? insulin detemir (LEVEMIR FLEXTOUCH) 100 UNIT/ML FlexPen Inject 42 Units into the skin at bedtime.    ? metFORMIN (GLUCOPHAGE-XR) 500 MG 24 hr tablet Take 500 mg by mouth every evening.    ? Multiple Vitamins-Minerals (CENTRUM SILVER PO) Take 1 tablet by mouth daily.    ? Multiple Vitamins-Minerals (PRESERVISION AREDS 2 PO) Take 1 tablet by mouth 2 (two) times daily.    ? rosuvastatin (CRESTOR) 20 MG tablet Take 20 mg by mouth at bedtime.    ? triamcinolone (NASACORT) 55 MCG/ACT AERO nasal inhaler Place 2 sprays into the nose daily as needed (allergies).    ? valsartan-hydrochlorothiazide (DIOVAN-HCT) 160-12.5 MG tablet Take 1 tablet by mouth daily.    ? ?No current facility-administered medications for this visit.  ? ? ?Allergies as of 07/29/2021 - Review Complete 07/29/2021  ?Allergen Reaction Noted  ? Demerol [meperidine hcl] Nausea And Vomiting 02/19/2020  ? Morphine and related Nausea And Vomiting 02/19/2020  ? Penicillins Hives  02/19/2020  ? ? ?Family History  ?Problem Relation Age of Onset  ? Colon cancer Father 78  ? ? ?Social History  ? ?Socioeconomic History  ? Marital status: Married  ?  Spouse name: Not on file  ? Number of children: Not on file  ? Years of education: Not on file  ? Highest education level: Not on file  ?Occupational History  ? Occupation: retired  ?  Comment: [employer]  ?Tobacco Use  ? Smoking status: Former  ? Smokeless tobacco: Never  ? Tobacco comments:  ?  quit age 48  ?Vaping Use  ? Vaping Use: Never used  ?Substance and Sexual Activity  ? Alcohol use: Never  ? Drug use: Never  ? Sexual activity: Not on file  ?Other Topics Concern  ? Not on file  ?Social History Narrative  ? Not on file  ? ?Social Determinants of Health  ? ?Financial  Resource Strain: Not on file  ?Food Insecurity: Not on file  ?Transportation Needs: Not on file  ?Physical Activity: Not on file  ?Stress: Not on file  ?Social Connections: Not on file  ? ? ? ?Review of Systems  ? ?Gen: Denies fever, chills, anorexia. Denies fatigue, weakness, weight loss.  ?CV: Denies chest pain, palpitations, syncope, peripheral edema, and claudication. ?Resp: Denies dyspnea at rest, cough, wheezing, coughing up blood, and pleurisy. ?GI: see HPI ?Derm: Denies rash, itching, dry skin ?Psych: Denies depression, anxiety, memory loss, confusion. No homicidal or suicidal ideation.  ?Heme: Denies bruising, bleeding, and enlarged lymph nodes. ? ? ?Physical Exam  ? ?BP 128/66   Pulse 70   Temp (!) 97.5 ?F (36.4 ?C) (Temporal)   Ht '5\' 4"'$  (1.626 m)   Wt 189 lb 6.4 oz (85.9 kg)   BMI 32.51 kg/m?  ? ?General:   Alert and oriented. No distress noted. Pleasant and cooperative.  ?Head:  Normocephalic and atraumatic. ?Eyes:  Conjuctiva clear without scleral icterus. ?Lungs:  Clear to auscultation bilaterally. No wheezes, rales, or rhonchi. No distress.  ?Heart:  S1, S2, irregular rhythm  ?Abdomen:  +BS, soft, non-tender and non-distended. No rebound or guarding. No HSM or  masses noted. ?Rectal: deferred ?Msk:  Symmetrical without gross deformities. Normal posture. ?Extremities:  Without edema. ?Neurologic:  Alert and  oriented x4 ?Psych:  Alert and cooperative. Normal mood and affect

## 2021-07-29 ENCOUNTER — Ambulatory Visit: Payer: Medicare PPO | Admitting: Gastroenterology

## 2021-07-29 ENCOUNTER — Encounter: Payer: Self-pay | Admitting: Gastroenterology

## 2021-07-29 VITALS — BP 128/66 | HR 70 | Temp 97.5°F | Ht 64.0 in | Wt 189.4 lb

## 2021-07-29 DIAGNOSIS — R1031 Right lower quadrant pain: Secondary | ICD-10-CM

## 2021-07-29 DIAGNOSIS — R198 Other specified symptoms and signs involving the digestive system and abdomen: Secondary | ICD-10-CM | POA: Diagnosis not present

## 2021-07-29 MED ORDER — DICYCLOMINE HCL 10 MG PO CAPS: 10.0000 mg | ORAL_CAPSULE | Freq: Three times a day (TID) | ORAL | 0 refills | Status: DC

## 2021-07-29 NOTE — Patient Instructions (Addendum)
I am attaching a handout about benefiber. I want you to begin taking 1-2 teaspoons daily whatever you are comfortable beginning with. Stop taking the metamucil. You may continue to the stool softener as needed and may take 2 on days you need more relief.  ? ?I sent in a prescription for dicyclomine (Bentyl) for the cramping however it flagged for potential cardiac effects that may outweigh the benefits for you. Continue to take tylenol for you pain as needed and lets see how the benefiber helps your symptoms. You may want to discuss with cardiology before we consider prescribing it (I want to keep you safe).  ? ?Let me know in a few weeks how you are doing or if things are worsened.  ? ?It was a pleasure to see you today. I want to create trusting relationships with patients. If you receive a survey regarding your visit,  I greatly appreciate you taking time to fill this out on paper or through your MyChart. I value your feedback. ? ?Venetia Night, MSN, FNP-BC, AGACNP-BC ?[facility] ? ? ?

## 2021-10-28 ENCOUNTER — Ambulatory Visit (INDEPENDENT_AMBULATORY_CARE_PROVIDER_SITE_OTHER): Payer: Medicare PPO | Admitting: Gastroenterology

## 2021-10-28 ENCOUNTER — Encounter: Payer: Self-pay | Admitting: Gastroenterology

## 2021-10-28 DIAGNOSIS — R198 Other specified symptoms and signs involving the digestive system and abdomen: Secondary | ICD-10-CM | POA: Diagnosis not present

## 2021-10-28 NOTE — Progress Notes (Signed)
Gastroenterology Office Note     Primary Care Physician:  Gardiner Rhyme, MD  Primary Gastroenterologist: Dr. Gala Romney    Chief Complaint   Chief Complaint  Patient presents with   Diarrhea    Follow up on abdominal pain and alternating diarrhea and constipation. Diarrhea and constipation has gotten some better in last 6 weeks. States the abdominal pain is now gone.     History of Present Illness   Briana Collier is a 76 y.o. female presenting today in follow-up with a history of constipation/diarrhea, and abdominal pain. Last seen in April 2023 with RLQ pain, constipation/diarrhea. Colonoscopy March 2022 with one inflammatory polyp and adenoma. No further surveillance due to age.   She started on Benefiber since last visit. Abdominal pain resolved. 2 teaspoons of Benefiber too strong. Takes 1. If no BM that day, will take 2. Takes dulcolax stool softener daily. If takes 2, will have diarrhea. At this point, having a BM at least every day. If starts having frequent stool, will take an Imodium. Never knows when loose stool will flare up. Can identify trigger foods. Believes on metformin since prior to 2020. Overall feels improved but just concerned as unable to pinpoint when diarrhea will occur. She notes low morning blood sugars in the 60s. Insulin has been decreased.    Past Medical History:  Diagnosis Date   Diabetes ([place])    Endometrial cancer ([place])    HTN (hypertension)    Hypercholesterolemia    MI (myocardial infarction) ([place])    about 9 years ago     Past Surgical History:  Procedure Laterality Date   cholecystectomy     COLONOSCOPY N/A 06/04/2020   iverticulosis in the sigmoid, descending, transverse colon, five 5-8 mm polyps in the sigmoid, descending, ascending colon   CORONARY ANGIOPLASTY WITH STENT PLACEMENT     around 2012   [place]     POLYPECTOMY  06/04/2020   Procedure: POLYPECTOMY;  Surgeon: Daneil Dolin, MD;  [facility];  Service: Endoscopy;;   REPLACEMENT TOTAL KNEE BILATERAL     right knee arthroscopy     TONSILLECTOMY     TOTAL ABDOMINAL HYSTERECTOMY  2020    Current Outpatient Medications  Medication Sig Dispense Refill   acetaminophen (TYLENOL) 650 MG CR tablet Take 1,300 mg by mouth at bedtime.     ALPRAZolam (XANAX) 0.25 MG tablet Take 0.25 mg by mouth daily as needed for anxiety.     amLODipine (NORVASC) 10 MG tablet Take 10 mg by mouth daily.     ASPERCREME LIDOCAINE EX Apply 1 application topically daily as needed (pain).     aspirin 81 MG EC tablet Take 81 mg by mouth daily.     glipiZIDE (GLUCOTROL) 5 MG tablet Take 5 mg by mouth 2 (two) times daily before a meal.     insulin detemir (LEVEMIR FLEXTOUCH) 100 UNIT/ML FlexPen Inject 42 Units into the skin at bedtime.     metFORMIN (GLUCOPHAGE-XR) 500 MG 24 hr tablet Take 500 mg by mouth every evening.     Multiple Vitamins-Minerals (CENTRUM SILVER PO) Take 1 tablet by mouth daily.     Multiple Vitamins-Minerals (PRESERVISION AREDS 2 PO) Take 1 tablet by mouth 2 (two) times daily.     rosuvastatin (CRESTOR) 20 MG tablet Take 20 mg by mouth at bedtime.     triamcinolone (NASACORT) 55 MCG/ACT AERO nasal inhaler Place 2 sprays into the nose daily as needed (  allergies).     valsartan-hydrochlorothiazide (DIOVAN-HCT) 160-12.5 MG tablet Take 1 tablet by mouth daily.     No current facility-administered medications for this visit.    Allergies as of 10/28/2021 - Review Complete 10/28/2021  Allergen Reaction Noted   Demerol [meperidine hcl] Nausea And Vomiting 02/19/2020   Morphine and related Nausea And Vomiting 02/19/2020   Penicillins Hives 02/19/2020    Family History  Problem Relation Age of Onset   Colon cancer Father 37    Social History   Socioeconomic History   Marital status: Married    Spouse name: Not on file   Number of children: Not on file   Years of education: Not on file   Highest education level: Not on file   Occupational History   Occupation: retired    Comment: [employer]  Tobacco Use   Smoking status: Former    Passive exposure: Past   Smokeless tobacco: Never   Tobacco comments:    quit age 48  Vaping Use   Vaping Use: Never used  Substance and Sexual Activity   Alcohol use: Never   Drug use: Never   Sexual activity: Not on file  Other Topics Concern   Not on file  Social History Narrative   Not on file   Social Determinants of Health   Financial Resource Strain: Not on file  Food Insecurity: Not on file  Transportation Needs: Not on file  Physical Activity: Not on file  Stress: Not on file  Social Connections: Not on file  Intimate Partner Violence: Not on file     Review of Systems   Gen: Denies any fever, chills, fatigue, weight loss, lack of appetite.  CV: Denies chest pain, heart palpitations, peripheral edema, syncope.  Resp: Denies shortness of breath at rest or with exertion. Denies wheezing or cough.  GI: see HPI GU : Denies urinary burning, urinary frequency, urinary hesitancy MS: Denies joint pain, muscle weakness, cramps, or limitation of movement.  Derm: Denies rash, itching, dry skin Psych: Denies depression, anxiety, memory loss, and confusion Heme: Denies bruising, bleeding, and enlarged lymph nodes.   Physical Exam   BP 136/74 (BP Location: Left Arm, Patient Position: Sitting, Cuff Size: Large)   Temp 97.8 F (36.6 C) (Oral)   Ht '5\' 4"'$  (1.626 m)   Wt 190 lb 1.6 oz (86.2 kg)   BMI 32.63 kg/m  General:   Alert and oriented. Pleasant and cooperative. Well-nourished and well-developed.  Head:  Normocephalic and atraumatic. Eyes:  Without icterus Abdomen:  +BS, soft, non-tender and non-distended. No HSM noted. No guarding or rebound. No masses appreciated.  Rectal:  Deferred  Msk:  Symmetrical without gross deformities. Normal posture. Extremities:  Without edema. Neurologic:  Alert and  oriented x4;  grossly normal neurologically. Skin:   Intact without significant lesions or rashes. Psych:  Alert and cooperative. Normal mood and affect.   Assessment   Briana Collier is a 76 y.o. female presenting today in follow-up with a history of alternating constipation and diarrhea, with colonoscopy up-to-date as of 2022. No further needed due to age.  She notes improvement with benefiber daily. At times will still have unexpected spells of diarrhea. She is able to pinpoint certain foods but still can be bothersome and will take Imodium. I note she has been on metformin for several years, even prior to this change in bowel habits. However, we discussed just a brief drug holiday for a few weeks to see if this helps. I  discussed with her that I have had some patients with onset of looser stool even after being on metformin for many years, with improvement after discontinuing.   We will reach out to PCP to update and ensure this is ok. Notably, her blood sugars have been running on the lower end upon waking and insulin needs have decreased. I do not anticipate an issue with holding this a few weeks.   PLAN    Anticipate holding metformin: will review with PCP. Hold about 2 weeks Continue fiber 3 month follow-up   Annitta Needs, PhD, [facility]

## 2021-10-28 NOTE — Patient Instructions (Signed)
Let's have you stop metformin for about 1-2 weeks and see if you have any improvement with this. We are letting your primary care know as well.  Continue fiber as you are doing!  We will see you in 3 months!  It was a pleasure to see you today. I want to create trusting relationships with patients to provide genuine, compassionate, and quality care. I value your feedback. If you receive a survey regarding your visit,  I greatly appreciate you taking time to fill this out.   Annitta Needs, PhD, ANP-BC [facility]

## 2021-11-02 ENCOUNTER — Telehealth: Payer: Self-pay

## 2021-11-02 NOTE — Telephone Encounter (Signed)
Note faxed to Dr Gardiner Rhyme, MD regarding pt stopping her Metformin for 2 weeks to see if it helps the diarrhea.

## 2021-12-23 ENCOUNTER — Encounter: Payer: Self-pay | Admitting: *Deleted
